# Patient Record
Sex: Male | Born: 1980 | Race: Black or African American | Hispanic: No | Marital: Single
Health system: Midwestern US, Academic
[De-identification: ages and names within clinical notes are randomized; demographics above are authoritative.]

---

## 2017-09-21 ENCOUNTER — Encounter: Admit: 2017-09-21 | Discharge: 2017-09-21 | Payer: BC Managed Care – PPO

## 2017-09-21 ENCOUNTER — Encounter: Admit: 2017-09-21 | Discharge: 2017-09-22 | Payer: BC Managed Care – PPO

## 2017-09-21 DIAGNOSIS — F1010 Alcohol abuse, uncomplicated: Secondary | ICD-10-CM

## 2017-09-22 ENCOUNTER — Inpatient Hospital Stay: Admit: 2017-09-22 | Discharge: 2017-09-22 | Payer: BC Managed Care – PPO

## 2017-09-22 ENCOUNTER — Inpatient Hospital Stay
Admit: 2017-09-22 | Discharge: 2017-09-22 | Disposition: A | Discharge status: Home / Self Care | Payer: BC Managed Care – PPO | Source: Other Acute Inpatient Hospital | Attending: Surgical Critical Care | Admitting: Critical Care Medicine

## 2017-09-22 DIAGNOSIS — M264 Malocclusion, unspecified: ICD-10-CM

## 2017-09-22 DIAGNOSIS — S02640A Fracture of ramus of mandible, unspecified side, initial encounter for closed fracture: Principal | ICD-10-CM

## 2017-09-22 DIAGNOSIS — F1010 Alcohol abuse, uncomplicated: ICD-10-CM

## 2017-09-22 LAB — BASIC METABOLIC PANEL
Lab: 140 MMOL/L (ref 137–147)
Lab: 0.9 mg/dL (ref 0.4–1.24)
Lab: 12 mg/dL (ref 7–25)
Lab: 139 MMOL/L (ref 137–147)
Lab: 77 mg/dL (ref 70–100)
Lab: 8.4 mg/dL — ABNORMAL LOW (ref >60)
Lab: >60 mL/min (ref >60)
Lab: >60 mL/min (ref >60)

## 2017-09-22 LAB — MAGNESIUM
Lab: 2 mg/dL (ref 1.6–2.6)
Lab: 1.8 mg/dL (ref 1.6–2.6)

## 2017-09-22 LAB — CBC
Lab: 10 10*3/uL (ref 4.5–11.0)
Lab: 10 K/UL (ref 4.5–11.0)

## 2017-09-22 LAB — PHOSPHORUS
Lab: 3.2 mg/dL (ref 2.0–4.5)
Lab: 2.6 mg/dL (ref 2.0–4.5)

## 2017-09-22 MED ORDER — ACETAMINOPHEN 325 MG PO TAB
650 mg | ORAL | 0 refills | Status: DC | PRN
Start: 2017-09-22 — End: 2017-09-22
  Administered 2017-09-22: 17:00:00 650 mg via ORAL

## 2017-09-22 MED ORDER — LACTATED RINGERS IV SOLP
INTRAVENOUS | 0 refills | Status: DC
Start: 2017-09-22 — End: 2017-09-22

## 2017-09-22 MED ORDER — AMPICILLIN/SULBACTAM 3G/100ML NS IVPB (MB+)
3 g | INTRAVENOUS | 0 refills | Status: DC
Start: 2017-09-22 — End: 2017-09-22
  Administered 2017-09-22 (×2): 3 g via INTRAVENOUS

## 2017-09-22 MED ORDER — FENTANYL CITRATE (PF) 50 MCG/ML IJ SOLN
25-50 ug | INTRAVENOUS | 0 refills | Status: DC | PRN
Start: 2017-09-22 — End: 2017-09-22

## 2017-09-22 MED ORDER — ACETAMINOPHEN 325 MG PO TAB
650 mg | ORAL | 0 refills | Status: AC | PRN
Start: 2017-09-22 — End: 2017-09-25

## 2017-09-22 MED ORDER — AMOXICILLIN-POT CLAVULANATE 875-125 MG PO TAB
875 mg | Freq: Two times a day (BID) | ORAL | 0 refills | Status: DC
Start: 2017-09-22 — End: 2017-09-22

## 2017-09-22 MED ORDER — ACETAMINOPHEN 325 MG PO TAB
650 mg | ORAL | 0 refills | Status: DC | PRN
Start: 2017-09-22 — End: 2017-09-22

## 2017-09-22 MED ORDER — ONDANSETRON HCL (PF) 4 MG/2 ML IJ SOLN
4-8 mg | INTRAVENOUS | 0 refills | Status: DC | PRN
Start: 2017-09-22 — End: 2017-09-22

## 2017-09-22 MED ORDER — ENOXAPARIN 40 MG/0.4 ML SC SYRG
40 mg | Freq: Every day | SUBCUTANEOUS | 0 refills | Status: DC
Start: 2017-09-22 — End: 2017-09-22

## 2017-09-22 MED ORDER — KETOROLAC 15 MG/ML IJ SOLN
15 mg | INTRAVENOUS | 0 refills | Status: DC
Start: 2017-09-22 — End: 2017-09-22
  Administered 2017-09-22: 15:00:00 15 mg via INTRAVENOUS

## 2017-09-22 MED ORDER — AMOXICILLIN-POT CLAVULANATE 875-125 MG PO TAB
875 mg | ORAL_TABLET | Freq: Two times a day (BID) | ORAL | 0 refills | Status: SS
Start: 2017-09-22 — End: 2017-09-25

## 2017-09-22 MED ORDER — IBUPROFEN 800 MG PO TAB
800 mg | ORAL_TABLET | ORAL | 0 refills | Status: AC | PRN
Start: 2017-09-22 — End: 2018-01-04

## 2017-09-23 DIAGNOSIS — S02600A Fracture of unspecified part of body of mandible, initial encounter for closed fracture: ICD-10-CM

## 2017-09-24 ENCOUNTER — Encounter: Admit: 2017-09-24 | Discharge: 2017-09-24 | Payer: BC Managed Care – PPO

## 2017-09-24 ENCOUNTER — Encounter: Admit: 2017-09-24 | Discharge: 2017-09-25 | Payer: BC Managed Care – PPO

## 2017-09-24 ENCOUNTER — Inpatient Hospital Stay: Admit: 2017-09-24 | Discharge: 2017-09-24 | Payer: BC Managed Care – PPO

## 2017-09-24 DIAGNOSIS — J45909 Unspecified asthma, uncomplicated: Principal | ICD-10-CM

## 2017-09-24 DIAGNOSIS — S02600A Fracture of unspecified part of body of mandible, initial encounter for closed fracture: Secondary | ICD-10-CM

## 2017-09-24 MED ORDER — BACITRACIN 50,000 UN NS 1000 ML IRR BAG (OR)
0 refills | Status: DC
Start: 2017-09-24 — End: 2017-09-24
  Administered 2017-09-24 (×2): 1000 mL

## 2017-09-24 MED ORDER — SODIUM CHLORIDE 0.9 % IV SOLP
INTRAVENOUS | 0 refills | Status: DC
Start: 2017-09-24 — End: 2017-09-25
  Administered 2017-09-24 – 2017-09-25 (×2): 1000.000 mL via INTRAVENOUS

## 2017-09-24 MED ORDER — OXYMETAZOLINE 0.05 % NA SPRY
2 | Freq: Once | NASAL | 0 refills | Status: CP
Start: 2017-09-24 — End: ?
  Administered 2017-09-24: 17:00:00 2 via NASAL

## 2017-09-24 MED ORDER — ACETAMINOPHEN 325 MG PO TAB
650 mg | ORAL | 0 refills | Status: DC
Start: 2017-09-24 — End: 2017-09-25
  Administered 2017-09-25 (×3): 650 mg via ORAL

## 2017-09-24 MED ORDER — SUFENTANIL 100 MCG IN NS 10 ML (OR)
0 refills | Status: DC
Start: 2017-09-24 — End: 2017-09-24

## 2017-09-24 MED ORDER — PHENYLEPHRINE IN 0.9% NACL(PF) 1 MG/10 ML (100 MCG/ML) IV SYRG
INTRAVENOUS | 0 refills | Status: DC
Start: 2017-09-24 — End: 2017-09-24
  Administered 2017-09-24: 18:00:00 50 ug via INTRAVENOUS

## 2017-09-24 MED ORDER — DOCUSATE SODIUM 50 MG/5 ML PO LIQD
100 mg | Freq: Two times a day (BID) | NASOGASTRIC | 0 refills | Status: DC
Start: 2017-09-24 — End: 2017-09-25

## 2017-09-24 MED ORDER — IPRATROPIUM BROMIDE 0.02 % IN SOLN
.5 mg | RESPIRATORY_TRACT | 0 refills | Status: DC | PRN
Start: 2017-09-24 — End: 2017-09-25

## 2017-09-24 MED ORDER — PROPOFOL INJ 10 MG/ML IV VIAL
0 refills | Status: DC
Start: 2017-09-24 — End: 2017-09-24
  Administered 2017-09-24 (×2): 20 mg via INTRAVENOUS

## 2017-09-24 MED ORDER — SUFENTANIL CITRATE 50 MCG/ML IV SOLN
0 refills | Status: DC
Start: 2017-09-24 — End: 2017-09-24
  Administered 2017-09-24: 18:00:00 30 ug via INTRAVENOUS

## 2017-09-24 MED ORDER — DEXTRAN 70-HYPROMELLOSE (PF) 0.1-0.3 % OP DPET
0 refills | Status: DC
Start: 2017-09-24 — End: 2017-09-24
  Administered 2017-09-24: 18:00:00 2 [drp] via OPHTHALMIC

## 2017-09-24 MED ORDER — ACETAMINOPHEN 500 MG PO TAB
1000 mg | Freq: Once | ORAL | 0 refills | Status: CP
Start: 2017-09-24 — End: ?
  Administered 2017-09-24: 17:00:00 1000 mg via ORAL

## 2017-09-24 MED ORDER — FENTANYL CITRATE (PF) 50 MCG/ML IJ SOLN
0 refills | Status: DC
Start: 2017-09-24 — End: 2017-09-24
  Administered 2017-09-24: 18:00:00 100 ug via INTRAVENOUS

## 2017-09-24 MED ORDER — DEXAMETHASONE SODIUM PHOSPHATE 4 MG/ML IJ SOLN
INTRAVENOUS | 0 refills | Status: DC
Start: 2017-09-24 — End: 2017-09-24
  Administered 2017-09-24: 18:00:00 10 mg via INTRAVENOUS

## 2017-09-24 MED ORDER — SUFENTANIL 100 MCG IN NS 10 ML (OR)
0 refills | Status: DC
Start: 2017-09-24 — End: 2017-09-24
  Administered 2017-09-24 (×2): .5 ug/kg/h via INTRAVENOUS

## 2017-09-24 MED ORDER — WHITE PETROLATUM-MINERAL OIL 83-15 % OP OINT
0 refills | Status: DC
Start: 2017-09-24 — End: 2017-09-24
  Administered 2017-09-24: 19:00:00 1 via OPHTHALMIC

## 2017-09-24 MED ORDER — ONDANSETRON HCL (PF) 4 MG/2 ML IJ SOLN
INTRAVENOUS | 0 refills | Status: DC
Start: 2017-09-24 — End: 2017-09-24
  Administered 2017-09-24: 22:00:00 4 mg via INTRAVENOUS

## 2017-09-24 MED ORDER — LIDOCAINE (PF) 10 MG/ML (1 %) IJ SOLN
.1-2 mL | INTRAMUSCULAR | 0 refills | Status: DC | PRN
Start: 2017-09-24 — End: 2017-09-25

## 2017-09-24 MED ORDER — TRAMADOL 50 MG PO TAB
50 mg | ORAL | 0 refills | Status: DC | PRN
Start: 2017-09-24 — End: 2017-09-25
  Administered 2017-09-25 (×3): 50 mg via ORAL

## 2017-09-24 MED ORDER — SUCCINYLCHOLINE CHLORIDE 20 MG/ML IJ SOLN
INTRAVENOUS | 0 refills | Status: DC
Start: 2017-09-24 — End: 2017-09-24
  Administered 2017-09-24: 18:00:00 120 mg via INTRAVENOUS

## 2017-09-24 MED ORDER — ONDANSETRON HCL (PF) 4 MG/2 ML IJ SOLN
4 mg | INTRAVENOUS | 0 refills | Status: DC | PRN
Start: 2017-09-24 — End: 2017-09-25

## 2017-09-24 MED ORDER — HYDROMORPHONE (PF) 2 MG/ML IJ SYRG
.5 mg | INTRAVENOUS | 0 refills | Status: DC | PRN
Start: 2017-09-24 — End: 2017-09-25

## 2017-09-24 MED ORDER — ESMOLOL 100 MG/10 ML (10 MG/ML) IV SOLN
0 refills | Status: DC
Start: 2017-09-24 — End: 2017-09-24
  Administered 2017-09-24: 18:00:00 10 mg via INTRAVENOUS
  Administered 2017-09-24 (×4): 20 mg via INTRAVENOUS

## 2017-09-24 MED ORDER — ALBUTEROL SULFATE 2.5 MG/0.5 ML IN NEBU
2.5 mg | RESPIRATORY_TRACT | 0 refills | Status: DC | PRN
Start: 2017-09-24 — End: 2017-09-25

## 2017-09-24 MED ORDER — DEXAMETHASONE SODIUM PHOSPHATE 10 MG/ML IJ SOLN
10 mg | INTRAVENOUS | 0 refills | Status: CP
Start: 2017-09-24 — End: ?
  Administered 2017-09-25 (×2): 10 mg via INTRAVENOUS

## 2017-09-24 MED ORDER — LIDOCAINE/BUPIVACAINE/EPINEPHRINE SYR (KRIET) (OR)
0 refills | Status: DC
Start: 2017-09-24 — End: 2017-09-24
  Administered 2017-09-24 (×3): 9 mL via INTRAMUSCULAR

## 2017-09-24 MED ORDER — FENTANYL CITRATE (PF) 50 MCG/ML IJ SOLN
50 ug | INTRAVENOUS | 0 refills | Status: DC | PRN
Start: 2017-09-24 — End: 2017-09-25

## 2017-09-24 MED ORDER — HEPARIN, PORCINE (PF) 5,000 UNIT/0.5 ML IJ SYRG
5000 [IU] | SUBCUTANEOUS | 0 refills | Status: DC
Start: 2017-09-24 — End: 2017-09-25

## 2017-09-24 MED ORDER — MIDAZOLAM 1 MG/ML IJ SOLN
INTRAVENOUS | 0 refills | Status: DC
Start: 2017-09-24 — End: 2017-09-24
  Administered 2017-09-24: 17:00:00 2 mg via INTRAVENOUS

## 2017-09-24 MED ORDER — DEXMEDETOMIDINE# 4MCG/ML IV SOLN
0 refills | Status: DC
Start: 2017-09-24 — End: 2017-09-24
  Administered 2017-09-24: 22:00:00 6 ug via INTRAVENOUS
  Administered 2017-09-24: 22:00:00 4 ug via INTRAVENOUS
  Administered 2017-09-24 (×2): 8 ug via INTRAVENOUS
  Administered 2017-09-24 (×2): 4 ug via INTRAVENOUS
  Administered 2017-09-24: 23:00:00 6 ug via INTRAVENOUS

## 2017-09-24 MED ORDER — MORPHINE 2 MG/ML IV CRTG
2 mg | INTRAVENOUS | 0 refills | Status: DC | PRN
Start: 2017-09-24 — End: 2017-09-25
  Administered 2017-09-25: 03:00:00 2 mg via INTRAVENOUS

## 2017-09-24 MED ORDER — CEFAZOLIN 1 GRAM IJ SOLR
0 refills | Status: DC
Start: 2017-09-24 — End: 2017-09-24
  Administered 2017-09-24 (×2): 2 g via INTRAVENOUS

## 2017-09-24 MED ORDER — LIDOCAINE (PF) 200 MG/10 ML (2 %) IJ SYRG
0 refills | Status: DC
Start: 2017-09-24 — End: 2017-09-24
  Administered 2017-09-24: 18:00:00 100 mg via INTRAVENOUS

## 2017-09-24 MED ORDER — DIPHENHYDRAMINE HCL 50 MG/ML IJ SOLN
25 mg | Freq: Once | INTRAVENOUS | 0 refills | Status: DC | PRN
Start: 2017-09-24 — End: 2017-09-25

## 2017-09-24 MED ORDER — BISACODYL 10 MG RE SUPP
10 mg | Freq: Every day | RECTAL | 0 refills | Status: DC | PRN
Start: 2017-09-24 — End: 2017-09-25

## 2017-09-24 MED ORDER — OXYCODONE 5 MG/5 ML PO SOLN
10 mg | Freq: Once | ORAL | 0 refills | Status: DC | PRN
Start: 2017-09-24 — End: 2017-09-25

## 2017-09-24 MED ORDER — CEFAZOLIN INJ 1GM IVP
1 g | INTRAVENOUS | 0 refills | Status: DC
Start: 2017-09-24 — End: 2017-09-25
  Administered 2017-09-25 (×2): 1 g via INTRAVENOUS

## 2017-09-24 MED ORDER — LACTATED RINGERS IV SOLP
1000 mL | INTRAVENOUS | 0 refills | Status: DC
Start: 2017-09-24 — End: 2017-09-25
  Administered 2017-09-24: 17:00:00 1000 mL via INTRAVENOUS
  Administered 2017-09-24: 19:00:00 1000.000 mL via INTRAVENOUS

## 2017-09-24 MED ORDER — MAGNESIUM HYDROXIDE 2,400 MG/10 ML PO SUSP
10 mL | Freq: Every evening | NASOGASTRIC | 0 refills | Status: DC
Start: 2017-09-24 — End: 2017-09-25

## 2017-09-25 ENCOUNTER — Inpatient Hospital Stay: Admit: 2017-09-24 | Discharge: 2017-09-24 | Payer: BC Managed Care – PPO

## 2017-09-25 DIAGNOSIS — S0269XA Fracture of mandible of other specified site, initial encounter for closed fracture: Principal | ICD-10-CM

## 2017-09-25 DIAGNOSIS — S02642A Fracture of ramus of left mandible, initial encounter for closed fracture: ICD-10-CM

## 2017-09-25 MED ORDER — DEXTRAN 70-HYPROMELLOSE 0.1-0.3 % OP DROP
1 [drp] | OPHTHALMIC | 3 refills | Status: AC | PRN
Start: 2017-09-25 — End: ?

## 2017-09-25 MED ORDER — AMOXICILLIN-POT CLAVULANATE 875-125 MG PO TAB
875 mg | ORAL_TABLET | Freq: Two times a day (BID) | ORAL | 0 refills | 7.00000 days | Status: AC
Start: 2017-09-25 — End: ?

## 2017-09-25 MED ORDER — TRAMADOL-ACETAMINOPHEN 37.5-325 MG PO TAB
1 | ORAL_TABLET | ORAL | 0 refills | 7.00000 days | Status: AC | PRN
Start: 2017-09-25 — End: 2017-10-17

## 2017-09-25 MED ORDER — DEXTRAN 70-HYPROMELLOSE 0.1-0.3 % OP DROP
1 [drp] | OPHTHALMIC | 0 refills | Status: DC | PRN
Start: 2017-09-25 — End: 2017-09-25
  Administered 2017-09-25: 14:00:00 1 [drp] via OPHTHALMIC

## 2017-09-25 MED ORDER — WHITE PETROLATUM-MINERAL OIL 57.7-31.9 % OP OINT
.25 [in_us] | Freq: Every evening | OPHTHALMIC | 0 refills | Status: DC
Start: 2017-09-25 — End: 2017-09-25

## 2017-09-25 MED ORDER — WHITE PETROLATUM-MINERAL OIL 57.7-31.9 % OP OINT
.25 [in_us] | Freq: Every evening | OPHTHALMIC | 3 refills | Status: AC
Start: 2017-09-25 — End: 2018-01-04

## 2017-09-25 MED ORDER — (INV) POLYVINYL ALCOHOL 1.4 % OP DROP
1 [drp] | OPHTHALMIC | 0 refills | Status: DC | PRN
Start: 2017-09-25 — End: 2017-09-25

## 2017-09-26 ENCOUNTER — Encounter: Admit: 2017-09-26 | Discharge: 2017-09-26 | Payer: BC Managed Care – PPO

## 2017-09-26 DIAGNOSIS — J45909 Unspecified asthma, uncomplicated: Principal | ICD-10-CM

## 2017-10-05 ENCOUNTER — Encounter: Admit: 2017-10-05 | Discharge: 2017-10-05 | Payer: BC Managed Care – PPO

## 2017-10-05 ENCOUNTER — Ambulatory Visit: Admit: 2017-10-05 | Discharge: 2017-10-06 | Payer: BC Managed Care – PPO

## 2017-10-05 DIAGNOSIS — S02622D Fracture of subcondylar process of left mandible, subsequent encounter for fracture with routine healing: ICD-10-CM

## 2017-10-05 DIAGNOSIS — S0266XD Fracture of symphysis of mandible, subsequent encounter for fracture with routine healing: ICD-10-CM

## 2017-10-05 DIAGNOSIS — J45909 Unspecified asthma, uncomplicated: Principal | ICD-10-CM

## 2017-10-05 DIAGNOSIS — R29810 Facial weakness: ICD-10-CM

## 2017-10-17 ENCOUNTER — Ambulatory Visit: Admit: 2017-10-17 | Discharge: 2017-10-18 | Payer: BC Managed Care – PPO

## 2017-10-17 ENCOUNTER — Encounter: Admit: 2017-10-17 | Discharge: 2017-10-17 | Payer: BC Managed Care – PPO

## 2017-10-17 DIAGNOSIS — S0266XD Fracture of symphysis of mandible, subsequent encounter for fracture with routine healing: ICD-10-CM

## 2017-10-17 DIAGNOSIS — S02622D Fracture of subcondylar process of left mandible, subsequent encounter for fracture with routine healing: ICD-10-CM

## 2017-10-17 DIAGNOSIS — R29810 Facial weakness: ICD-10-CM

## 2017-10-17 DIAGNOSIS — J45909 Unspecified asthma, uncomplicated: Principal | ICD-10-CM

## 2017-11-16 ENCOUNTER — Ambulatory Visit: Admit: 2017-11-16 | Discharge: 2017-11-17 | Payer: BC Managed Care – PPO

## 2017-11-16 ENCOUNTER — Encounter: Admit: 2017-11-16 | Discharge: 2017-11-16 | Payer: BC Managed Care – PPO

## 2017-11-16 DIAGNOSIS — S0266XD Fracture of symphysis of mandible, subsequent encounter for fracture with routine healing: ICD-10-CM

## 2017-11-16 DIAGNOSIS — R29810 Facial weakness: ICD-10-CM

## 2017-11-16 DIAGNOSIS — J45909 Unspecified asthma, uncomplicated: Principal | ICD-10-CM

## 2017-11-16 DIAGNOSIS — S02622D Fracture of subcondylar process of left mandible, subsequent encounter for fracture with routine healing: ICD-10-CM

## 2018-01-04 ENCOUNTER — Encounter: Admit: 2018-01-04 | Discharge: 2018-01-04 | Payer: BC Managed Care – PPO

## 2018-01-04 ENCOUNTER — Ambulatory Visit: Admit: 2018-01-04 | Discharge: 2018-01-05 | Payer: BC Managed Care – PPO

## 2018-01-04 DIAGNOSIS — J45909 Unspecified asthma, uncomplicated: Principal | ICD-10-CM

## 2018-01-04 DIAGNOSIS — S02622D Fracture of subcondylar process of left mandible, subsequent encounter for fracture with routine healing: ICD-10-CM

## 2018-01-04 DIAGNOSIS — S0266XD Fracture of symphysis of mandible, subsequent encounter for fracture with routine healing: ICD-10-CM

## 2018-01-04 DIAGNOSIS — R29810 Facial weakness: ICD-10-CM

## 2018-01-23 IMAGING — CR UP_EXM
3 series · 3 of 3 positions shown · non-contrast
Comparison: none

[hand]
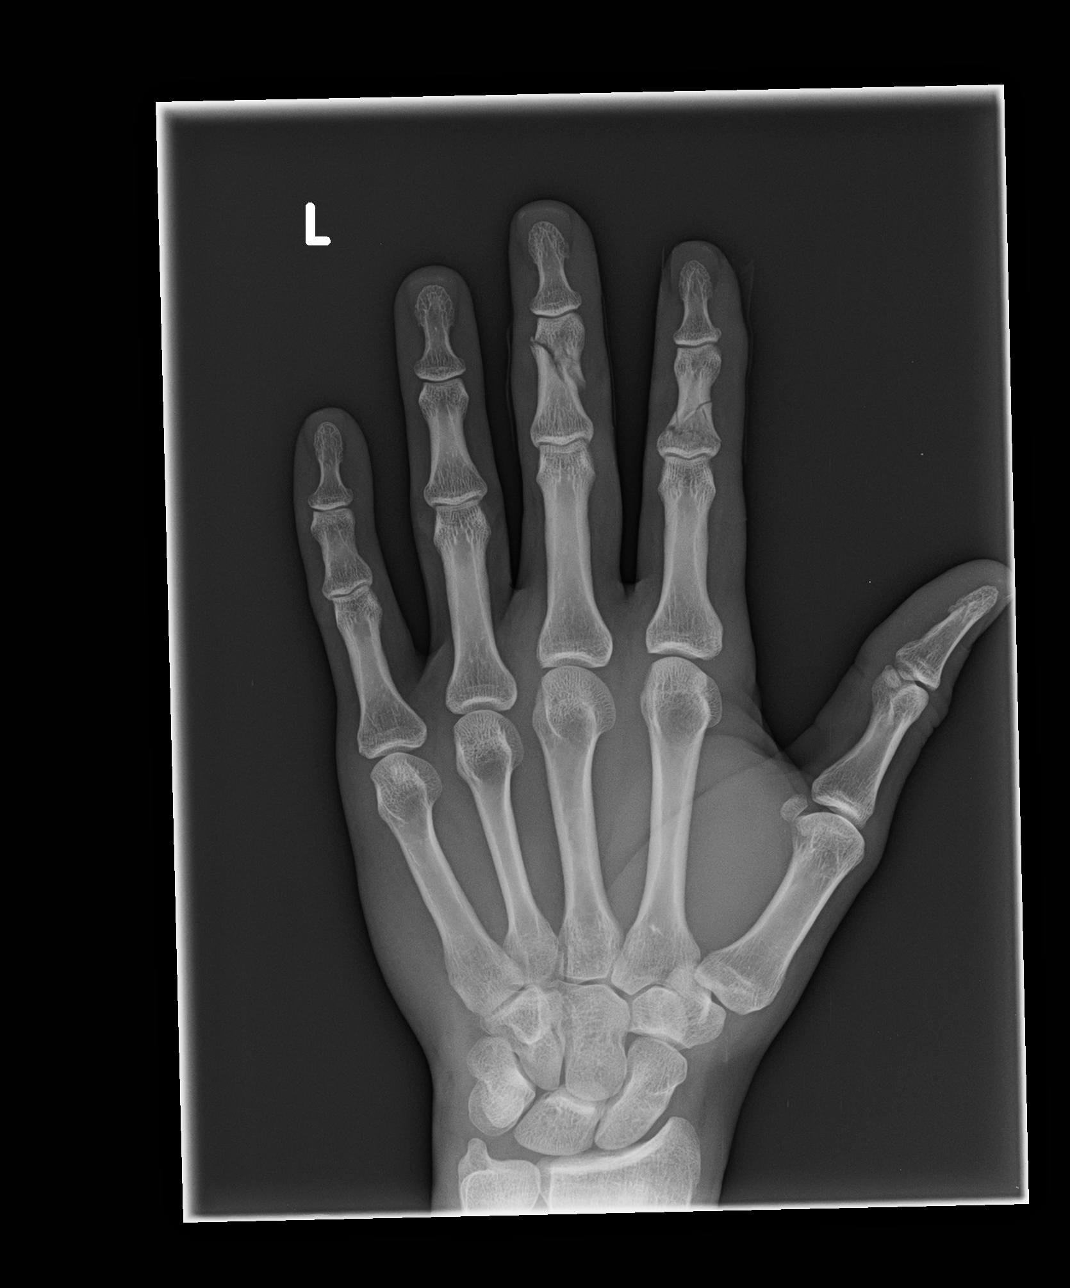

[hand obl]
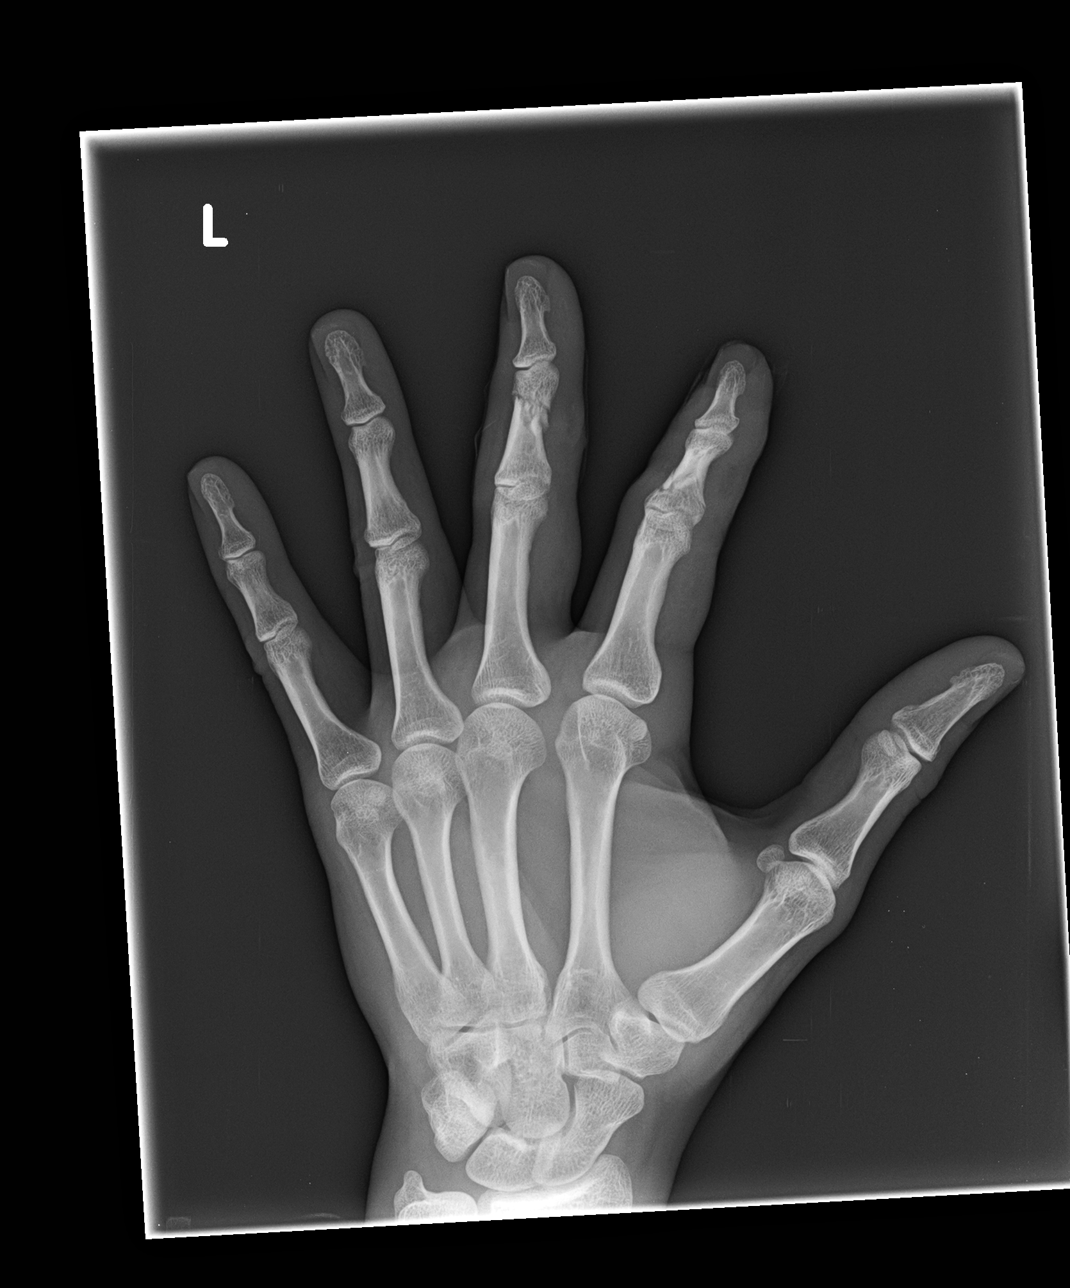

[hand lat]
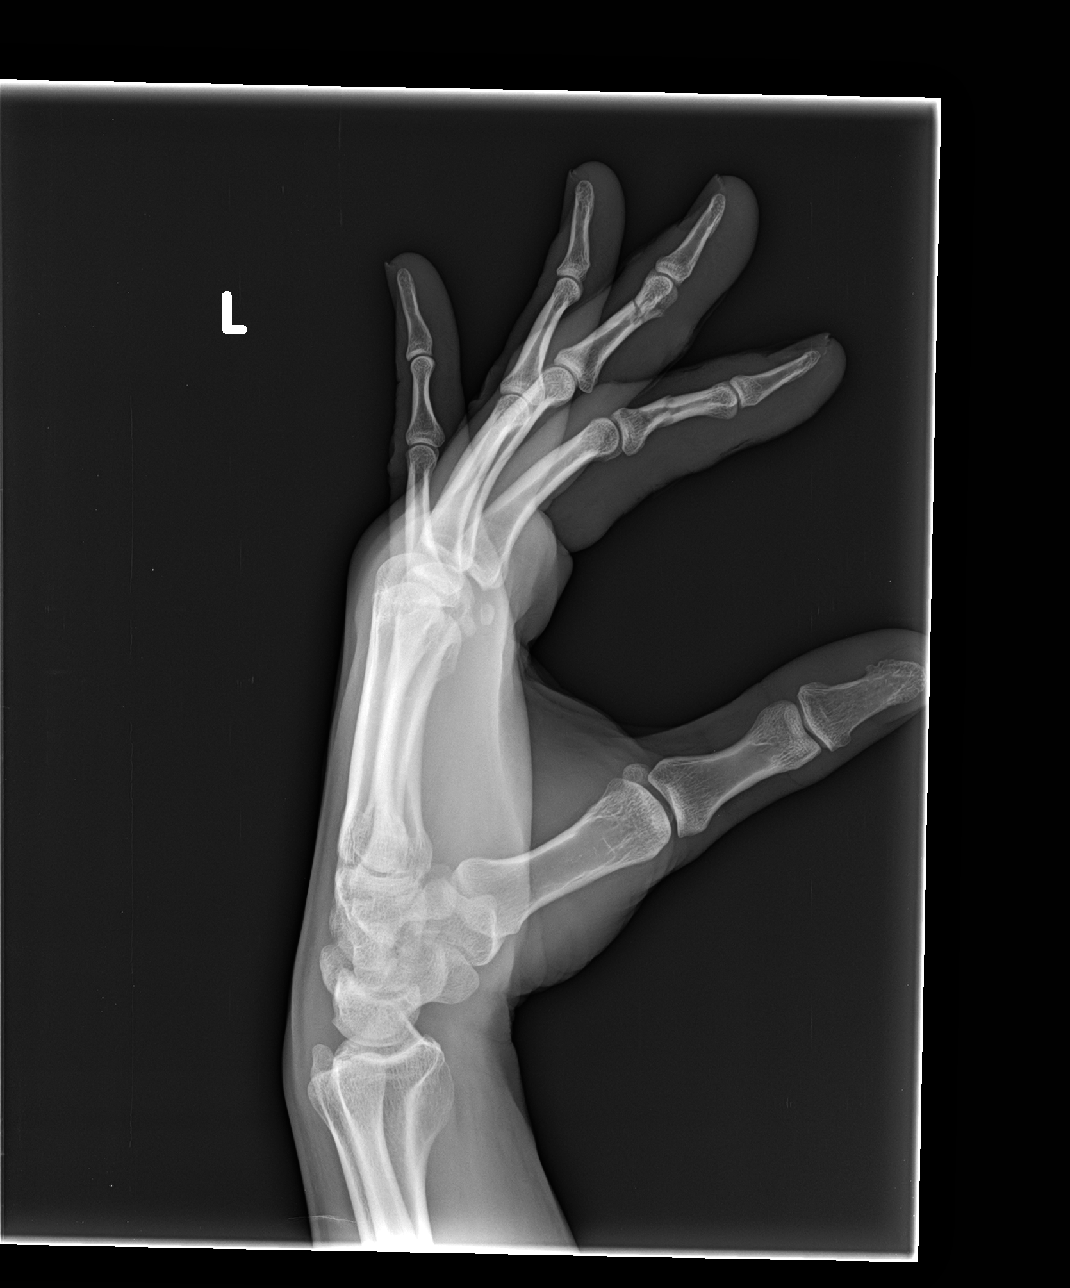

[3 of 3 positions shown; findings below may reference images not displayed]

DIAGNOSTIC STUDIES

EXAM
Left hand radiographs.

INDICATION
Smashed left hand
left hand, distal 2nd and 3rd finger smash injury - pt shielded - Ak

TECHNIQUE
Three views of the left hand.

COMPARISONS
None.

FINDINGS
Comminuted fractures of the 2nd and 3rd middle phalanges. The alignment approach is anatomic. There
is no compelling evidence of intra-articular extension.

IMPRESSION
Second and 3rd middle phalangeal fractures.

Tech Notes:

left hand, distal 2nd and 3rd finger smash injury - pt shielded - Ak

## 2020-04-28 ENCOUNTER — Encounter: Admit: 2020-04-28 | Discharge: 2020-04-28

## 2020-04-28 ENCOUNTER — Emergency Department: Admit: 2020-04-28 | Discharge: 2020-04-29

## 2020-04-28 ENCOUNTER — Emergency Department: Admit: 2020-04-28 | Discharge: 2020-04-28

## 2020-04-28 DIAGNOSIS — S81831A Puncture wound without foreign body, right lower leg, initial encounter: Secondary | ICD-10-CM

## 2020-04-28 LAB — BASIC METABOLIC PANEL
Lab: 1.1 mg/dL (ref 0.4–1.24)
Lab: 111 mg/dL — ABNORMAL HIGH (ref 70–100)
Lab: 141 MMOL/L (ref 137–147)
Lab: 15 mg/dL (ref 7–25)
Lab: 8.4 mg/dL — ABNORMAL LOW (ref 8.5–10.6)
Lab: 9 pg (ref 3–12)
Lab: >60 mL/min (ref >60)

## 2020-04-28 LAB — LACTIC ACID (BG - RAPID LACTATE): Lab: 1 MMOL/L (ref 0.5–2.0)

## 2020-04-28 LAB — CBC: Lab: 5.6 K/UL (ref 4.5–11.0)

## 2020-04-28 LAB — CREATINE KINASE-CPK: Lab: 424 U/L — ABNORMAL HIGH (ref 35–232)

## 2020-04-28 LAB — ALCOHOL LEVEL: Lab: <10 mg/dL (ref 21–30)

## 2020-04-28 MED ORDER — PIPERACILLIN/TAZOBACTAM 3.375 G/100ML NS IVPB (MB+)
3.375 g | INTRAVENOUS | 0 refills | Status: DC
Start: 2020-04-28 — End: 2020-04-28

## 2020-04-28 MED ORDER — PIPERACILLIN/TAZOBACTAM 3.375 G/100ML NS IVPB (MB+)
3.375 g | Freq: Once | INTRAVENOUS | 0 refills | Status: CP
Start: 2020-04-28 — End: ?
  Administered 2020-04-28 (×2): 3.375 g via INTRAVENOUS

## 2020-04-28 MED ORDER — DOXYCYCLINE HYCLATE 100 MG PO TAB
100 mg | ORAL_TABLET | Freq: Two times a day (BID) | ORAL | 0 refills | 8.00000 days | Status: AC
Start: 2020-04-28 — End: ?

## 2020-04-28 MED ORDER — OXYCODONE 10 MG PO TAB
10 mg | Freq: Once | ORAL | 0 refills | Status: DC
Start: 2020-04-28 — End: 2020-04-28

## 2020-04-28 MED ORDER — OXYCODONE 10 MG PO TAB
10 mg | Freq: Once | ORAL | 0 refills | Status: CP
Start: 2020-04-28 — End: ?
  Administered 2020-04-28: 23:00:00 10 mg via ORAL

## 2020-04-28 MED ORDER — OXYCODONE 5 MG PO TAB
5 mg | ORAL_TABLET | ORAL | 0 refills | 6.00000 days | Status: AC | PRN
Start: 2020-04-28 — End: ?

## 2020-04-28 MED ORDER — OXYCODONE 5 MG/5 ML PO SOLN
10 mg | Freq: Once | ORAL | 0 refills | Status: CP
Start: 2020-04-28 — End: ?
  Administered 2020-04-28: 22:00:00 10 mg via ORAL

## 2020-04-28 NOTE — ED Notes
Assumed care of pt; Pt post trauma, bleeding controlled @ RLE.  Pt rates pain at 5/10.  Leg elevated for comfort.  Pt A&Ox4 resting in bed, respirations even and non-labored. Skin is warm, dry, intact and appropriate for ethnicity. Pt remains on cardiac monitoring. Bed is in lowest locked position, call light within reach.

## 2020-04-28 NOTE — Consults
[facility] Consult Note      Admission Date: 04/28/2020                                                  Chief Complaint/Reason for Consult:  Metal bars in R posterior calf    Assessment/Plan  Mason Robinson is a 40 y.o. male w/ HTN who presents as a type 1 trauma after sustaining a machinery injury that led to small metal bars being shot and dislodged into his posterior R calf.    - Images reviewed  - Images, history and clinical presentation consistent with posterior R calf soft tissue injury without any evidence of osseous or vascular injury.  -Operative Intervention - No acute operative indication at this time.  -Last PO intake: 1200 1/5  -WB Status - WBAT RLE  -Antibiotics / Tetanus - per primary  -Pain Control - per primary  -Diet - per primary  -DVT PPX - Mechanical, Chemoprophylaxis  - per primary  -Recommend administering a dose of zosyn in the ED.   -Recommend sending out on Doxycycline 100 mg BID for 2 weeks.  -Recommend bedside I&D (performed by trauma surgery at bedside) and local wound care.  -Recommend follow-up with Dr. Cherene Julian in 1 week. Will set up the appointment with his clinic nurse. Please call [phone] if patient doesn't hear anything by next week.    Patient evaluated with staff surgeon Dr. Cherene Julian who directed plan of care.      During normal business hours, please contact Geni Bers (Pager [phone] on [facility]), Danae Chen [phone]), Tiffany Ward [phone]) or Jiles Prows [phone]) . At all other times, contact the orthopedic surgery resident on call for any questions or concerns. PLEASE CONTACT THE PRIMARY ORTHOPEDIC TEAM FIRST BEFORE PAGING THE ORTHOPEDIC RESIDENT ON-CALL (PAGER NUMBERS PROVIDED ABOVE).     ______________________________________________________________________    History of Present Illness: Mason Robinson is a 40 y.o. male.     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 He was working at his machine when he suddenly experienced RLE pain. He looked down and several of the small bars had been dislodged into his R lower leg. He was brought to the hospital. He denies numbness, tingling. He denies any other symptoms or any other injuries. He last ate at 1200.    Prior to ortho arrival, the bars had been removed from the patient's leg as they were loosely in the leg. Trauma surgery performed a bedside I&D.    He states that he believes he has a history of high blood pressure. He has a 20 pack year history, drinks liquor daily, and smokes marijuana.     Medical History:   Diagnosis Date   ? Asthma    ? Closed fracture of symphysis of mandible with routine healing 10/05/2017   ? Closed subcondylar fracture of left side of mandible with routine healing 10/05/2017   ? Facial weakness 10/05/2017     Surgical History:   Procedure Laterality Date   ? OPEN REDUCTION/ INTERNAL FIXATION RIGHT MANDIBULAR FRACTURE, OPEN REDUCTION/INTERNAL FIXATION LEFT MANDIBULAR FRACTURE Bilateral 09/24/2017    Performed by Fernande Boyden., MD at [facility]   ? EXTRACTION TOOTH Right 09/24/2017    Performed by Fernande Boyden., MD at [facility]  Social History     Tobacco Use   ? Smoking status: Current Every Day Smoker     Packs/day: 1.00     Years: 20.00     Pack years: 20.00     Types: Cigarettes   ? Smokeless tobacco: Never Used   ? Tobacco comment: haven't smoked for 11 days   Substance Use Topics   ? Alcohol use: Yes     Alcohol/week: 31.0 standard drinks     Types: 7 Cans of beer, 24 Shots of liquor per week   ? Drug use: Yes     Frequency: 14.0 times per week     Types: Marijuana     No family history on file.  Allergies:  Bee sting [venom-honey bee] and Seafood    Outpatient Medications as of 04/28/2020   Medication Sig Dispense Refill   ? dextran 70/hypromellose (NATURAL BALANCE TEARS) 0.1/0.3 % ophthalmic solution Apply one drop to both eyes as Needed. 30 mL 3         Review of Systems:  10 Point Review of Systems Obtained. Pertinent items noted in HPI.     Pos - pain  Neg- numbness, tingling, fever, chills    Vital Signs:  Last Filed in 24 hours   BP: 171/108 (01/05 1600)  Pulse: 74 (01/05 1600)  Respirations: 20 PER MINUTE (01/05 1600)  SpO2: 100 % (01/05 1600)  SpO2 Pulse: 77 (01/05 1600)     Physical Exam:    Constitutional: Alert, NAD  HEENT: Normocephalic, no scleral icterus  Respiratory: Unlabored respirations on RA  Cardiovascular: Regular rate  Abdomen: Nondistended, NTTP  Lymph: No significant lymphedema, no lymphadenopathy of affected extremity  Skin: Normal Turgor  Musculoskeletal:    RLE: NVI distally, TA/GS, EHL/FHL intact.  SILT in DP/SP/Tib/Sur/Saph distributions, DP/PT palpable, Cap refill < 2 sec, warm extremity, soft compartments. There is a medial and lateral wound in the middle 1/3 of the posterior calf. There is no active bleeding.    Neurologic: Grossly intact, No focal deficits noted unless listed above    Lab/Radiology/Other Diagnostic Tests:    CBC w/Diff   Lab Results   Component Value Date/Time    WBC 5.6 04/28/2020 02:40 PM    HGB 14.9 04/28/2020 02:40 PM    HCT 44.1 04/28/2020 02:40 PM    PLTCT 227 04/28/2020 02:40 PM        Inflammatory Markers   No results found for: ESR, CRP     Basic Metabolic Profile   Lab Results   Component Value Date/Time    NA 141 04/28/2020 02:40 PM    K 4.1 04/28/2020 02:40 PM    CL 105 04/28/2020 02:40 PM    CO2 27 04/28/2020 02:40 PM    GAP 9 04/28/2020 02:40 PM    BUN 15 04/28/2020 02:40 PM    CR 1.13 04/28/2020 02:40 PM    GLU 111 (H) 04/28/2020 02:40 PM        Coagulation Studies   No results found for: PT, PTT, INR     Radiology:   TIBIA & FIBULA 2 VIEWS RIGHT    Result Date: 04/28/2020  TIBIA & FIBULA 2 VIEWS RIGHT Technique: 2 views of the right tibia/fibula were obtained. Clinical indication: Activation of trauma. Leg injury. Comparison: None     Findings and Impression: 1.  No acute fracture or dislocation. 2.  Scattered foci of gas within the mid posterolateral soft tissues, likely from clinically reported puncture wound. 3.  There are  a few punctate radiopacities within the posterolateral soft tissues, likely radiopaque foreign bodies. By my electronic signature, I attest that I have personally reviewed the images for this examination and formulated the interpretations and opinions expressed in this report  Finalized by Ivory Broad, M.D. on 04/28/2020 4:28 PM. Dictated by Rubbie Battiest, D.O. on 04/28/2020 3:45 PM.       Carolin Guernsey, MD  [phone]

## 2020-04-28 NOTE — Other
Brief procedure note    Foreign Body Removal    Date/Time: 04/28/2020 2:34 PM  Consent: Verbal consent obtained.  Consent given by: patient  Patient understanding: patient states understanding of the procedure being performed  Patient consent: the patient's understanding of the procedure matches consent given  Patient identity confirmed: verbally with patient and arm band  Time out: Immediately prior to procedure a "time out" was called to verify the correct patient, procedure, equipment, support staff and site/side marked as required.  Performed by: Veatrice Bourbon, MD  Authorized by: Valerie Salts, MD   Intake: R calf.    Sedation:  Patient sedated: yes  Sedation type: moderate (conscious) sedation  Sedatives: propofol    Patient restrained: no  Patient cooperative: yes  7 objects recovered.  Objects recovered: 7 metal rods  Post-procedure assessment: foreign body removed  Patient tolerance: patient tolerated the procedure well with no immediate complications. No arterial bleeding noted after removal of objects. Wound irrigated with 1L NS. Patient remained neurovascularly intact post procedure.  Attending Attestation: I personally performed the procedure myself.

## 2020-04-28 NOTE — Progress Notes
Trauma Activation Type: Type I with rebar going through right calf.    I spoke with flight Nurse and they stated patient was at work and something with wrong with a machine and rebar went through the right calf of patient.     Mason Robinson was able to speak with medical staff to share with them what happened. I was not able to speak with Mason Robinson and no family was present.    The spiritual care team is available as needed, 24/7, through the campus switchboard [phone]). For a response within 24 hours, please submit an order in O2 for a chaplain consult.

## 2020-04-28 NOTE — ED Notes
Dr Kern Reap at bedside to irrigate and pack wound.  Pt tolerated this well.

## 2020-04-28 NOTE — H&P (View-Only)
Admission History and Physical Examination    Mason Robinson  Admission Date:  04/28/2020                     __________________________________________________________________________________  HPI: Mason Robinson is a 40 y.o. year old male activated as a Type 2 trauma after being impaled in the leg by re-bar.  Patient was working at [employer], wearing a pressurized piece of equipment ruptured, sending 7 pieces of rebar through the back of his right calf.  Denies any other injuries.  Tetanus shot was updated at outside hospital.  Patient was given 1 g of Rocephin at outside hospital.  Denies any loss of consciousness or hitting his head.    PMH: Hypertension  Medical History:   Diagnosis Date   ? Asthma    ? Closed fracture of symphysis of mandible with routine healing 10/05/2017   ? Closed subcondylar fracture of left side of mandible with routine healing 10/05/2017   ? Facial weakness 10/05/2017       PSH: Chart repair  Surgical History:   Procedure Laterality Date   ? OPEN REDUCTION/ INTERNAL FIXATION RIGHT MANDIBULAR FRACTURE, OPEN REDUCTION/INTERNAL FIXATION LEFT MANDIBULAR FRACTURE Bilateral 09/24/2017    Performed by Fernande Boyden., MD at [facility]   ? EXTRACTION TOOTH Right 09/24/2017    Performed by Fern[facility] Boyden., MD at [facility]       Meds: Lisinopril   Current Outpatient Medications   Medication Instructions   ? dextran 70/hypromellose (NATURAL BALANCE TEARS) 0.1/0.3 % ophthalmic solution 1 drop, Both Eyes, AS NEEDED       Allergies    Allergies   Allergen Reactions   ? Bee Sting [Venom-Honey Bee] ANAPHYLAXIS   ? Seafood ANAPHYLAXIS       SH: Drinks daily, and never had any signs of withdrawal.  1 shot of liquor or one 40 ounce can of beer per day.  Social History     Socioeconomic History   ? Marital status: Single     Spouse name: Not on file   ? Number of children: Not on file   ? Years of education: Not on file   ? Highest education level: Not on file Occupational History   ? Not on file   Tobacco Use   ? Smoking status: Current Every Day Smoker     Packs/day: 1.00     Years: 20.00     Pack years: 20.00     Types: Cigarettes   ? Smokeless tobacco: Never Used   ? Tobacco comment: haven't smoked for 11 days   Substance and Sexual Activity   ? Alcohol use: Yes     Alcohol/week: 31.0 standard drinks     Types: 7 Cans of beer, 24 Shots of liquor per week   ? Drug use: Yes     Frequency: 14.0 times per week     Types: Marijuana   ? Sexual activity: Not on file   Other Topics Concern   ? Not on file   Social History Narrative   ? Not on file       FH: Denies any major family medical problems    Physical Examination    Primary survey:   Airway patent to voice, trachea midline   Breath sounds equal bilaterally, equal chest rise   Carotid, radial, femoral, DP palpable bilaterally, heart sounds clear  GCS 15, 5/5 strength/sensation in bilateral upper and lower extremities     Fast  Exam:  Not indicated    Secondary survey:   HEENT: Pupils 3 mm bilat, no skull/maxillofacial bony deformities or TTP, no scalp lacs/abrasions, no otorrhea/rhinorrhea  CHEST: no clavicular crepitus, sternal or thoracic TTP/bony deformities, bilateral axillae clear   ABD: s/nt/nd   BACK: no C,T,L,S spine TTP or step-off deformities, bilateral flanks clear  PELVIS: no obvious instability, gluteal tone intact  EXT: no obvious long bone deformities, abrasions or lacs to bilateral upper extremities and LLE. RLE w/ 7 large pieces of metal through the posterior calf. No active arterial bleeding    A/P: Mason Robinson is a 40 y.o. year old male w/ a PMH of HTN who presents after penetrating injury to RLE.     Plan  - Trauma  labs  - XR R tib/fib  - Tetanus updated at OSH  - Consult Ortho  - Dispo pending orthopedics consult    Trauma Attending On-Call, Dr. Durene Cal, present during the care of this patient.    Veatrice Bourbon, MD

## 2020-04-28 NOTE — ED Provider Notes
Mason Robinson is a 40 y.o. male.    Chief Complaint:  Chief Complaint   Patient presents with   ? Leg Injury       History of Present Illness:  Patient is a 40 year old male with a reported past medical history of HTN presenting for a leg injury, patient is a trauma transfer activated as a type II trauma.  Patient reports that he was at work earlier today when he was impaled through the right calf with rebar.  He denies injuries elsewhere.  He reports pain in his right calf but denies any pain elsewhere.  He was reportedly given Rocephin and a Tdap shot prior to arrival.          Review of Systems:  Review of Systems   Unable to perform ROS: Acuity of condition       Allergies:  Bee sting [venom-honey bee] and Seafood    Past Medical History:  Medical History:   Diagnosis Date   ? Asthma    ? Closed fracture of symphysis of mandible with routine healing 10/05/2017   ? Closed subcondylar fracture of left side of mandible with routine healing 10/05/2017   ? Facial weakness 10/05/2017       Past Surgical History:  Surgical History:   Procedure Laterality Date   ? OPEN REDUCTION/ INTERNAL FIXATION RIGHT MANDIBULAR FRACTURE, OPEN REDUCTION/INTERNAL FIXATION LEFT MANDIBULAR FRACTURE Bilateral 09/24/2017    Performed by Fernande Boyden., MD at [facility]   ? EXTRACTION TOOTH Right 09/24/2017    Performed by Fernande Boyden., MD at [facility] OR       Pertinent medical/surgical history reviewed    Social History:  Social History     Tobacco Use   ? Smoking status: Current Every Day Smoker     Packs/day: 1.00     Years: 20.00     Pack years: 20.00     Types: Cigarettes   ? Smokeless tobacco: Never Used   ? Tobacco comment: haven't smoked for 11 days   Substance Use Topics   ? Alcohol use: Yes     Alcohol/week: 31.0 standard drinks     Types: 7 Cans of beer, 24 Shots of liquor per week   ? Drug use: Yes     Frequency: 14.0 times per week     Types: Marijuana     Social History     Substance and Sexual Activity   Drug Use Yes   ? Frequency: 14.0 times per week   ? Types: Marijuana             Family History:  No family history on file.    Vitals:  ED Vitals    Date and Time T BP P RR SPO2P SPO2 User   04/28/20 1830 -- -- 83 21 PER MINUTE 80 -- RT   04/28/20 1800 -- 162/117 58 17 PER MINUTE 58 97 % RT   04/28/20 1730 -- 184/116 87 24 PER MINUTE 95 -- RT   04/28/20 1700 -- 171/112 64 15 PER MINUTE 74 98 % RT   04/28/20 1630 -- 201/105 84 16 PER MINUTE 83 99 % RT   04/28/20 1600 -- 171/108 74 20 PER MINUTE 77 100 % RT   04/28/20 1530 -- 159/141 78 19 PER MINUTE 72 97 % RT   04/28/20 1500 -- 175/105 83 25 PER MINUTE 88 100 % RT          Physical Exam:  Physical Exam  Vitals and nursing note reviewed.   Constitutional:       Appearance: Normal appearance. He is normal weight.   HENT:      Head: Normocephalic and atraumatic.   Eyes:      Extraocular Movements: Extraocular movements intact.      Conjunctiva/sclera: Conjunctivae normal.   Cardiovascular:      Rate and Rhythm: Normal rate and regular rhythm.   Pulmonary:      Effort: Pulmonary effort is normal.      Breath sounds: Normal breath sounds.   Abdominal:      General: Bowel sounds are normal. There is no distension.      Palpations: Abdomen is soft.      Tenderness: There is no abdominal tenderness.   Musculoskeletal:         General: Normal range of motion.      Cervical back: Neck supple.      Comments: 6-8 rebar bars sticking through right calf projecting medially.  Patient is distally neurovascularly intact with range of motion of ankle and foot intact.   Skin:     General: Skin is warm and dry.   Neurological:      Mental Status: He is oriented to person, place, and time. Mental status is at baseline.   Psychiatric:         Mood and Affect: Mood normal.         Behavior: Behavior normal.         Laboratory Results:  Labs Reviewed   BASIC METABOLIC PANEL - Abnormal       Result Value Ref Range Status    Sodium 141  137 - 147 MMOL/L Final    Potassium 4.1  3.5 - 5.1 MMOL/L Final Chloride 105  98 - 110 MMOL/L Final    CO2 27  21 - 30 MMOL/L Final    Anion Gap 9  3 - 12 Final    Glucose 111 (*) 70 - 100 MG/DL Final    Blood Urea Nitrogen 15  7 - 25 MG/DL Final    Creatinine 1.61  0.4 - 1.24 MG/DL Final    Calcium 8.4 (*) 8.5 - 10.6 MG/DL Final    eGFR >09  >60 mL/min Final   CREATINE KINASE-CPK - Abnormal    Creatine Kinase 424 (*) 35 - 232 U/L Final   CBC    White Blood Cells 5.6  4.5 - 11.0 K/UL Final    RBC 4.74  4.4 - 5.5 M/UL Final    Hemoglobin 14.9  13.5 - 16.5 GM/DL Final    Hematocrit 45.4  40 - 50 % Final    MCV 93.2  80 - 100 FL Final    MCH 31.5  26 - 34 PG Final    MCHC 33.8  32.0 - 36.0 G/DL Final    RDW 09.8  11 - 15 % Final    Platelet Count 227  150 - 400 K/UL Final    MPV 7.8  7 - 11 FL Final   LACTIC ACID (BG - RAPID LACTATE)    Lactic Acid,BG 1.0  0.5 - 2.0 MMOL/L Final   ALCOHOL LEVEL    Alcohol <10  MG/DL Final   POC URINE DIPSTICK BLOOD   TYPE & CROSSMATCH    Units Ordered 0   Final    Crossmatch Expires 05/01/2020,2359   Final    Record Check 2ND TYPE REQUIRED   Final    ABO/RH(D) A POS  Final    Antibody Screen NEG   Final   BLOOD TYPE CONFIRMATION - ORDER ONLY IF REQUESTED BY LAB    ABO/RH(D) A POS   Final          Radiology Interpretation:    TIBIA & FIBULA 2 VIEWS RIGHT   Final Result   Findings and Impression:       1.  No acute fracture or dislocation.      2.  Scattered foci of gas within the mid posterolateral soft tissues, likely from clinically reported puncture wound.      3.  There are a few punctate radiopacities within the posterolateral soft tissues, likely radiopaque foreign bodies.      By my electronic signature, I attest that I have personally reviewed the images for this examination and formulated the interpretations and opinions expressed in this report          Finalized by Ivory Broad, M.D. on 04/28/2020 4:28 PM. Dictated by Rubbie Battiest, D.O. on 04/28/2020 3:45 PM.         POC TRAUMA ACTIVATION FAST EXAM    (Results Pending)         EKG:    ED Course:  Pt seen and evaluated immediately upon arrival as a Trauma Activation.  ATLS protocol followed.    IV access was established.   Pt placed on oxygen prn.  Pt placed on cardiac and pulse oximetry monitors.  Initial labs and imaging tests were ordered by nursing via protocol/order set for the trauma team.    Emergency medicine team available to evaluate the airway and to address any acute airway needs/issues.  Patient's airway was patent to voice with a GCS of 15.    Procedural sedation was provided in the trauma bay by the ED resident and staff with propofol to facilitate removal of the rebar.  Rebar was removed without issue and wound was dressed at bedside with gauze.  See full procedural sedation note below    Available labs and imaging reviewed and noted as above.    Pt care discussed with the trauma team.  Labs and imaging ordered by the trauma team and to be followed by the trauma team.  Unless otherwise noted, all further labs, radiology orders, interventions, procedures, consultations and disposition decisions were done at the discretion of the trauma team/attending.  Pt care transferred to the trauma team and attending upon exit of the trauma bay.  Disposition per the trauma team: Patient discharged following Ortho consultation           ED Scoring:                             MDM  Reviewed: previous chart, nursing note and vitals  Interpretation: x-ray        Facility Administered Meds:  Medications   SODIUM CHLORIDE 0.9 % IR SOLN (Cabinet Override) (has no administration in time range)   oxyCODONE (ROXICODONE) oral solution 10 mg (10 mg Oral Med Not Given 04/28/20 1614)   piperacillin/tazobactam (ZOSYN) 3.375 g in sodium chloride 0.9% (NS) 100 mL IVPB (MB+) (0 g Intravenous Infusion Stopped 04/28/20 1700)   oxyCODONE tablet 10 mg (10 mg Oral Given 04/28/20 1647)         Clinical Impression:  Clinical Impression   Penetrating injury of lower extremity, right, initial encounter       Disposition/Follow up  ED Disposition     ED Disposition  Discharge        Emergency Department: [facility], [facility]  [address]  [address]  [place] [address]  [phone]    As needed, If symptoms worsen    Orthopedics and Sports Medicine: [facility]  [address]  [address], [address]  [place] [address]  [phone]    Follow up in one week. They should be calling you for an appointment, please call the number above if they do not call you.      Medications:  Discharge Medication List as of 04/28/2020  7:06 PM      START taking these medications    Details   doxycycline hyclate (VIBRACIN) 100 mg tablet Take one tablet by mouth twice daily for 14 days., Disp-28 tablet, R-0, Normal      oxyCODONE (ROXICODONE) 5 mg tablet Take one tablet by mouth every 8 hours as needed for Pain for up to 5 doses, Disp-5 tablet, R-0, Normal             Procedure Notes:  Procedural Sedation    Date/Time: 04/28/2020 3:30 PM  Performed by: Allyne Gee, MD  Authorized by: Verl Bangs, MD   Consent: Verbal consent obtained. Written consent not obtained.  Consent given by: patient  Patient identity confirmed: verbally with patient and arm band  Time out: Immediately prior to procedure a time out was called to verify the correct patient, procedure, equipment, support staff and site/side marked as required.  Indications: foreign body removal  Patient history of Anesthetic Complications: None  Family History of Anesthetic Complications: None  Preparations: pulse oximeter, suctioning equipment available, cardiac monitoring, O2 per nasal cannula, additional MD available for assistance and constant attendence by RN until patient recovered  Pre sedation vital signs: I reviewed pre-procedure vital signs  Certified physician present: Certified physician observer present for patient monitoring  Sedation level: moderate  Sedation: Propofol.  Sedation vital signs: Vital signs monitored during sedation  Complications: No complications  Post sedation condition: I have reviewed patient's vital signs and condition is stable  Attending Attestation: I was present during the entire procedure by a resident or midlevel.I was personally responsible for the administration of moderate sedation services during the procedure performed and I confirm requirements described in CPT section on moderate sedation were followed, including the use of an independent trained observer who had no other duties during the procedure.For the drug(s) utilized see nursing log for details.    The total moderate sedation time was 10 minutes.              Attestation / Supervision:  Suanne Marker, MD  PGY-2  Department of Emergency Medicine  [place] [phone]

## 2020-04-29 ENCOUNTER — Encounter: Admit: 2020-04-29 | Discharge: 2020-04-29

## 2020-04-29 DIAGNOSIS — R69 Illness, unspecified: Secondary | ICD-10-CM

## 2020-04-29 NOTE — ED Notes
Pt A&Ox4 and given discharge instructions and follow up information. Pt verbalizes understanding and has no further questions. VSS. Pt remains in ED 7 waiting on ride.

## 2020-05-04 NOTE — Progress Notes
Date of Service: 05/04/2020      History of Present Illness  Mason Robinson is a 40 y.o. male who presents today following an injury he sustained at work on 04/28/20 and was evaluated in [facility].  On 04/28/20 he states he was using a machine at work that molds small metal bars and one of the metal bars dislodged from the machine and he states he heard a loud pop and tried to clear out of the way but unable to and a piece of metal bar dislodge into his right lower leg.  He states he misread the direction on his abx script and for the first day or 2 he only took one tablet daily instead of 2 daily.  He states he can put weight on his leg but it is painful.  He c/o pain when he dorsiflexes his foot.       Medical History:   Diagnosis Date   ? Asthma    ? Closed fracture of symphysis of mandible with routine healing 10/05/2017   ? Closed subcondylar fracture of left side of mandible with routine healing 10/05/2017   ? Facial weakness 10/05/2017       Surgical History:   Procedure Laterality Date   ? OPEN REDUCTION/ INTERNAL FIXATION RIGHT MANDIBULAR FRACTURE, OPEN REDUCTION/INTERNAL FIXATION LEFT MANDIBULAR FRACTURE Bilateral 09/24/2017    Performed by Fernande Boyden., MD at [facility]   ? EXTRACTION TOOTH Right 09/24/2017    Performed by Fernande Boyden., MD at [facility]       No family history on file.    Social History     Occupational History   ? Not on file   Tobacco Use   ? Smoking status: Current Every Day Smoker     Packs/day: 1.00     Years: 20.00     Pack years: 20.00     Types: Cigarettes   ? Smokeless tobacco: Never Used   ? Tobacco comment: haven't smoked for 11 days   Substance and Sexual Activity   ? Alcohol use: Yes     Alcohol/week: 31.0 standard drinks     Types: 7 Cans of beer, 24 Shots of liquor per week   ? Drug use: Yes     Frequency: 14.0 times per week     Types: Marijuana   ? Sexual activity: Not on file       Allergies   Allergen Reactions   ? Bee Sting [Venom-Honey Bee] ANAPHYLAXIS   ? Seafood ANAPHYLAXIS         Review of Systems   Constitutional: Positive for activity change.   Musculoskeletal: Positive for gait problem.   All other systems reviewed and are negative.        Objective:         ? doxycycline hyclate (VIBRACIN) 100 mg tablet Take one tablet by mouth twice daily for 14 days.   ? oxyCODONE (ROXICODONE) 5 mg tablet Take one tablet by mouth every 8 hours as needed for Pain for up to 5 doses     Vitals:    05/04/20 1158   Weight: 86.6 kg (191 lb)   Height: 188 cm (74.02)     Body mass index is 24.51 kg/m?Marland Kitchen     Physical Exam  Ortho Exam  General: WDWN male in NAD  HEENT: MMM  CV: RRR  RESP: unlabored  ABD: no gross masses   RLE:  He has open wound on inside and outside of  his leg. The inside open wound is at junction of middle and distal third of his leg and this is largest of the wounds.  Inside wound in largest dimension measures 2.5 cm superior to inferior and 1.5 cm anterior to posterior. Depth to underlying muscle fascia.  the posterior wound measures 1.5 cm in diameter and depth down to underlying muscle fascia as well.  No evidence of gross infx.  His leg is swollen. If I palpate the path of the rod it hurts him.  2+ posterior tibial pulse.  2+DPP.  SILT throughout foot.        Assessment and Plan:  Penetrating injury to right leg that does not involve bone    -We discussed treatment options to include local wound care vs surgical debridement of the wound.    -After discussion he would like to try the local wound care and I think this is reasonable but I would like to follow him closely.   -continue Doxycycline and I reordered this today to continue.   -I would like him to start some physical therapy as his foot and ankle are starting to get stiff from not being active.    -WBAT and ROMAT RLE  -for wound care: Wet to dry dressing with saline and gauze - changed daily  -continue off work until next appointment  -follow up in 1 week to recheck his wound.  -will get him CAM boot today that he can ambulate in as well as tubigrip for swelling control      Due to COVID-19 pandemic, I have taken down these notes in presence of Odis Hollingshead, MD using [employer]

## 2020-05-07 ENCOUNTER — Encounter: Admit: 2020-05-07 | Discharge: 2020-05-07

## 2020-05-07 NOTE — Progress Notes
Work [employer]  [employer]  [id number]  [place], [place]  [address]    Claim # [id number]  Date of Injury 04/28/2020    Workers Compensation Adjuster  Inetta Fermo Price  [id number] [phone] (F)

## 2020-05-18 ENCOUNTER — Encounter: Admit: 2020-05-18 | Discharge: 2020-05-18

## 2020-05-18 ENCOUNTER — Ambulatory Visit: Admit: 2020-05-18 | Discharge: 2020-05-19 | Payer: Worker's Compensation

## 2020-05-18 DIAGNOSIS — S0266XD Fracture of symphysis of mandible, subsequent encounter for fracture with routine healing: Secondary | ICD-10-CM

## 2020-05-18 DIAGNOSIS — J45909 Unspecified asthma, uncomplicated: Secondary | ICD-10-CM

## 2020-05-18 DIAGNOSIS — R29810 Facial weakness: Secondary | ICD-10-CM

## 2020-05-18 DIAGNOSIS — S02622D Fracture of subcondylar process of left mandible, subsequent encounter for fracture with routine healing: Secondary | ICD-10-CM

## 2020-05-18 DIAGNOSIS — S81839A Puncture wound without foreign body, unspecified lower leg, initial encounter: Secondary | ICD-10-CM

## 2020-05-18 NOTE — Progress Notes
S:  Mason Robinson returns today for his right leg injury he sustained on 04/28/20 when a piece of metal penetrated his leg. He has been doing dressing changes and states he ran out of saline the other day and noticed an odor changing the dressing.  He feels like his swelling has also improved.  He state he is able to stand without the boot and put a little weight but hasn't been able to take many steps yet.  He starts therapy tomorrow.  He is doing dressing changes once a day.     PE:  Overall his wounds look better today.  His swelling in his extremity looks better.  The dimension of medial wound 30 mm superior to inferior by 20 mm anteior to posterior.  His posterior lateral wound  14 mm in diameter.  Both of these have healthy base down to muscle.  No evidence of infx.  Swelling along medial aspect of his ankle. He contributes this to WB in CAM boot.  2+DPP.  He can DF 2 degrees past neutral.  He can PF 62 degrees past neutral.  Fully extend his knee and he can flex his knee fully.      A/P:  penetrating injury to right leg currently with open wound that he is receiving daily twice day wound care    -WBAT and ROMAT RLE  -Begin physical therapy as ordered at last visit.   -I do not think that this wound has healed to point that he can safely return to work place yet at any capacity.    -We will reevaluate this when he return to clinic in 3 weeks.       Due to COVID-19 pandemic, I have taken down these notes in presence of Odis Hollingshead, MD using [employer], [employer]

## 2020-05-19 ENCOUNTER — Encounter: Admit: 2020-05-19 | Discharge: 2020-05-19

## 2020-05-19 NOTE — Progress Notes
Emailed Mason Robinson OVN and work status from yesterday appointment.  Also uploaded to [employer] website claim [id number]  DOL 04/28/20

## 2020-05-29 ENCOUNTER — Encounter: Admit: 2020-05-29 | Discharge: 2020-05-29

## 2020-05-29 MED ORDER — DOXYCYCLINE HYCLATE 100 MG PO TAB
100 mg | ORAL_TABLET | Freq: Two times a day (BID) | ORAL | 0 refills
Start: 2020-05-29 — End: ?

## 2020-06-08 ENCOUNTER — Encounter

## 2020-06-08 DIAGNOSIS — S81839A Puncture wound without foreign body, unspecified lower leg, initial encounter: Principal | ICD-10-CM

## 2020-06-08 DIAGNOSIS — S0266XD Fracture of symphysis of mandible, subsequent encounter for fracture with routine healing: Secondary | ICD-10-CM

## 2020-06-08 DIAGNOSIS — R29810 Facial weakness: Secondary | ICD-10-CM

## 2020-06-08 DIAGNOSIS — J45909 Unspecified asthma, uncomplicated: Secondary | ICD-10-CM

## 2020-06-08 DIAGNOSIS — S02622D Fracture of subcondylar process of left mandible, subsequent encounter for fracture with routine healing: Secondary | ICD-10-CM

## 2020-06-08 MED ORDER — SODIUM CHLORIDE 0.9 % IR SOLN
0 refills | 30.00000 days | Status: AC
Start: 2020-06-08 — End: ?

## 2020-06-08 MED ORDER — ROLLED GAUZE 4 X 75 " TP BNDG
3 refills | Status: AC
Start: 2020-06-08 — End: ?

## 2020-06-08 MED ORDER — GAUZE BANDAGE 4 X 4 " TP SPGE
TOPICAL | 3 refills | Status: AC
Start: 2020-06-08 — End: ?

## 2020-06-08 NOTE — Progress Notes
Emailed Mason Robinson with [employer] OVN, work status from today and PT orders and all supplies ordered today

## 2020-06-08 NOTE — Progress Notes
S:  Mason Robinson sustained a penetrating injury to his right leg when a piece of metal lodged into his right lower leg on 04/28/20.   He returns today 6 weeks out.    He states the wound is still open. He states he has been able to walk without his boot and even drive.  He has started physical therapy and feels like this is helping with his ankle ROM.   He denies pain in his leg but feels some tension where the wound is located.  He states he has been doing his dressing changes but has had to pay out of pocket for some of the supplies.      PE:  His wound is improving in appearence.  Medial wound demonstrates healthy granulation tissue and measures approx 22 mm superior to inferior by 5 mm anterior to posterior. Depth goes to underlying subacute tissue about 2 mm.  Lateral sided area is predominately circular.  It is 9 mm diameter. It goes down to underlying subacute and healthy granulation tissue.  Depth appears to be 3-4 mm.  2+DPP.  2+posterior tib pulse. SILT throughout foot. 5/5 EHL, anterior tibial and gastroc. 5/5 peroneals and posterior tibialis. DF today is approx 8 degrees past neutral.  PF is approx 65 degrees past neutral.      A/P:  6 weeks s/p penetrating injury to right leg and currently has open wound that he is receiving daily twice day wound care    -WBAT and ROMAT RLE  -continue dressing changes twice daily and script for new supplies provided today  -we will plan to touch base in 2 weeks and at that time plan to return him to work light duty with a 25 lb weight limit, no ladder climbing   -for the next two weeks I recommend he remain off work while he continues to heal this wound.      Due to COVID-19 pandemic, I have taken down these notes in presence of Odis Hollingshead, MD using [employer], [employer]

## 2020-06-09 ENCOUNTER — Encounter

## 2020-06-17 ENCOUNTER — Encounter: Admit: 2020-06-17 | Discharge: 2020-06-17

## 2020-06-17 MED ORDER — DOXYCYCLINE HYCLATE 100 MG PO TAB
100 mg | ORAL_TABLET | Freq: Two times a day (BID) | ORAL | 0 refills
Start: 2020-06-17 — End: ?

## 2020-06-22 ENCOUNTER — Ambulatory Visit: Admit: 2020-06-22 | Discharge: 2020-06-23 | Payer: Worker's Compensation

## 2020-06-22 ENCOUNTER — Encounter: Admit: 2020-06-22 | Discharge: 2020-06-22

## 2020-06-22 DIAGNOSIS — S81839A Puncture wound without foreign body, unspecified lower leg, initial encounter: Principal | ICD-10-CM

## 2020-06-22 MED ORDER — ROLLED GAUZE 4 X 75 " TP BNDG
3 refills | Status: AC
Start: 2020-06-22 — End: ?

## 2020-06-22 MED ORDER — SODIUM CHLORIDE 0.9 % IR SOLN
0 refills | 30.00000 days | Status: AC
Start: 2020-06-22 — End: ?

## 2020-06-22 MED ORDER — GAUZE BANDAGE 4 X 4 " TP SPGE
TOPICAL | 3 refills | Status: AC
Start: 2020-06-22 — End: ?

## 2020-06-22 NOTE — Progress Notes
Emailed notes, orders and work status to [employer] with [employer]

## 2020-06-25 ENCOUNTER — Encounter: Admit: 2020-06-25 | Discharge: 2020-06-25

## 2020-06-25 NOTE — Progress Notes
Rec'd call from [facility] with [employer] who has been added to patient file as his case Production designer, theatre/television/film. Ph [phone] Fax [phone] Attn claim [id number]    Faxed orders to her so she can get him supplies through [employer].

## 2020-07-20 ENCOUNTER — Encounter: Admit: 2020-07-20 | Discharge: 2020-07-20

## 2020-07-20 ENCOUNTER — Ambulatory Visit: Admit: 2020-07-20 | Discharge: 2020-07-21 | Payer: Worker's Compensation

## 2020-07-20 DIAGNOSIS — R29810 Facial weakness: Secondary | ICD-10-CM

## 2020-07-20 DIAGNOSIS — J45909 Unspecified asthma, uncomplicated: Secondary | ICD-10-CM

## 2020-07-20 DIAGNOSIS — S81839A Puncture wound without foreign body, unspecified lower leg, initial encounter: Principal | ICD-10-CM

## 2020-07-20 DIAGNOSIS — S0266XD Fracture of symphysis of mandible, subsequent encounter for fracture with routine healing: Secondary | ICD-10-CM

## 2020-07-20 DIAGNOSIS — S02622D Fracture of subcondylar process of left mandible, subsequent encounter for fracture with routine healing: Secondary | ICD-10-CM

## 2020-07-20 NOTE — Progress Notes
Faxed OVN and work status from today to [employer] [facility] at [employer] [phone] Attn claim [phone]

## 2020-07-20 NOTE — Progress Notes
S:  Mason Robinson returns today for his right leg injury that he sustained on 04/28/20 when a piece of metal rod propelled through his right posterior leg region.  AT his last visit we released him to return to work with 25lb weight limit and no climbing ladders.  He states that he was able to return to work and has exceeded his WB limit when there was a Writer at work but has remained off ladders.  When he did have to lift more weight he denies any issues. He states therapy has been going well but feels like he is getting near the end of therapy to be DC.     PE:  His wounds have healed very well.  Small areas on medial and lateral aspect of his leg.  The medial side is completely healed.  On the lateral side small area 1-1.5 mm of some granulation tissue.    NVI  5/5 muslce strength all major muscle groups  A/P:  12 weeks s/p penetrating right leg injury in which a metal rod was propelled through his right posterior leg region    -I think he has reached maximum medical improvement from this injury  -he is activity as tolerated on his RLE  -Will release him to full duty at this point  -follow up prn    Due to COVID-19 pandemic, I have taken down these notes in presence of Odis Hollingshead, MD using [employer]

## 2020-07-26 ENCOUNTER — Encounter: Admit: 2020-07-26 | Discharge: 2020-07-26

## 2020-07-26 NOTE — Progress Notes
rec'd request for from Cleora Fleet for [facility] for PPD rating report from Dr. Cherene Julian

## 2020-08-13 ENCOUNTER — Encounter: Admit: 2020-08-13 | Discharge: 2020-08-13

## 2020-08-13 NOTE — Progress Notes
WC rating report completed and sent to [employer] Cleora Fleet
